# Patient Record
Sex: Female | Born: 1955 | Race: Black or African American | Hispanic: No | Smoking: Current every day smoker
Health system: Southern US, Community
[De-identification: ages and names within clinical notes are randomized; demographics above are authoritative.]

## PROBLEM LIST (undated history)

## (undated) DIAGNOSIS — F419 Anxiety disorder, unspecified: Secondary | ICD-10-CM

## (undated) DIAGNOSIS — C3490 Malignant neoplasm of unspecified part of unspecified bronchus or lung: Secondary | ICD-10-CM

## (undated) DIAGNOSIS — C801 Malignant (primary) neoplasm, unspecified: Secondary | ICD-10-CM

## (undated) DIAGNOSIS — E785 Hyperlipidemia, unspecified: Secondary | ICD-10-CM

## (undated) DIAGNOSIS — J439 Emphysema, unspecified: Secondary | ICD-10-CM

## (undated) DIAGNOSIS — I10 Essential (primary) hypertension: Secondary | ICD-10-CM

## (undated) HISTORY — PX: ECTOPIC PREGNANCY SURGERY: SHX613

## (undated) HISTORY — PX: BREAST EXCISIONAL BIOPSY: SUR124

## (undated) HISTORY — PX: PARTIAL HYSTERECTOMY: SHX80

## (undated) HISTORY — DX: Malignant neoplasm of unspecified part of unspecified bronchus or lung: C34.90

## (undated) HISTORY — DX: Malignant (primary) neoplasm, unspecified: C80.1

## (undated) HISTORY — DX: Emphysema, unspecified: J43.9

## (undated) HISTORY — PX: DENTAL SURGERY: SHX609

## (undated) HISTORY — DX: Anxiety disorder, unspecified: F41.9

## (undated) HISTORY — PX: COLONOSCOPY W/ POLYPECTOMY: SHX1380

## (undated) HISTORY — DX: Hyperlipidemia, unspecified: E78.5

## (undated) HISTORY — DX: Essential (primary) hypertension: I10

## (undated) HISTORY — PX: BREAST LUMPECTOMY: SHX2

---

## 2003-03-05 ENCOUNTER — Emergency Department (HOSPITAL_COMMUNITY): Admission: EM | Admit: 2003-03-05 | Discharge: 2003-03-05 | Payer: Self-pay | Admitting: Emergency Medicine

## 2003-11-20 ENCOUNTER — Emergency Department (HOSPITAL_COMMUNITY): Admission: EM | Admit: 2003-11-20 | Discharge: 2003-11-20 | Payer: Self-pay | Admitting: *Deleted

## 2004-01-18 ENCOUNTER — Other Ambulatory Visit: Admission: RE | Admit: 2004-01-18 | Discharge: 2004-01-18 | Payer: Self-pay | Admitting: Obstetrics and Gynecology

## 2004-01-22 ENCOUNTER — Encounter: Admission: RE | Admit: 2004-01-22 | Discharge: 2004-01-22 | Payer: Self-pay | Admitting: Obstetrics and Gynecology

## 2004-02-27 ENCOUNTER — Encounter: Admission: RE | Admit: 2004-02-27 | Discharge: 2004-02-27 | Payer: Self-pay | Admitting: Internal Medicine

## 2004-09-04 ENCOUNTER — Encounter: Admission: RE | Admit: 2004-09-04 | Discharge: 2004-09-04 | Payer: Self-pay | Admitting: *Deleted

## 2005-02-04 ENCOUNTER — Encounter: Admission: RE | Admit: 2005-02-04 | Discharge: 2005-02-04 | Payer: Self-pay | Admitting: Obstetrics and Gynecology

## 2005-03-14 ENCOUNTER — Encounter: Admission: RE | Admit: 2005-03-14 | Discharge: 2005-03-14 | Payer: Self-pay | Admitting: Obstetrics and Gynecology

## 2005-05-25 ENCOUNTER — Encounter: Admission: RE | Admit: 2005-05-25 | Discharge: 2005-05-25 | Payer: Self-pay | Admitting: *Deleted

## 2006-11-04 ENCOUNTER — Encounter: Admission: RE | Admit: 2006-11-04 | Discharge: 2006-11-04 | Payer: Self-pay | Admitting: Obstetrics and Gynecology

## 2006-11-06 ENCOUNTER — Encounter: Admission: RE | Admit: 2006-11-06 | Discharge: 2006-11-06 | Payer: Self-pay | Admitting: Obstetrics and Gynecology

## 2007-04-20 ENCOUNTER — Emergency Department (HOSPITAL_COMMUNITY): Admission: EM | Admit: 2007-04-20 | Discharge: 2007-04-20 | Payer: Self-pay | Admitting: Emergency Medicine

## 2007-11-15 ENCOUNTER — Ambulatory Visit (HOSPITAL_COMMUNITY): Admission: RE | Admit: 2007-11-15 | Discharge: 2007-11-15 | Payer: Self-pay | Admitting: Family Medicine

## 2008-05-10 ENCOUNTER — Emergency Department (HOSPITAL_COMMUNITY): Admission: EM | Admit: 2008-05-10 | Discharge: 2008-05-10 | Payer: Self-pay | Admitting: Emergency Medicine

## 2008-05-26 ENCOUNTER — Encounter: Admission: RE | Admit: 2008-05-26 | Discharge: 2008-05-26 | Payer: Self-pay | Admitting: Internal Medicine

## 2008-12-13 ENCOUNTER — Ambulatory Visit (HOSPITAL_COMMUNITY): Admission: RE | Admit: 2008-12-13 | Discharge: 2008-12-13 | Payer: Self-pay | Admitting: Internal Medicine

## 2009-09-08 ENCOUNTER — Emergency Department (HOSPITAL_COMMUNITY): Admission: EM | Admit: 2009-09-08 | Discharge: 2009-09-08 | Payer: Self-pay | Admitting: Emergency Medicine

## 2010-03-08 ENCOUNTER — Ambulatory Visit (HOSPITAL_COMMUNITY)
Admission: RE | Admit: 2010-03-08 | Discharge: 2010-03-08 | Payer: Self-pay | Source: Home / Self Care | Attending: Unknown Physician Specialty | Admitting: Unknown Physician Specialty

## 2010-03-23 ENCOUNTER — Encounter: Payer: Self-pay | Admitting: Obstetrics and Gynecology

## 2010-06-13 LAB — TSH: TSH: 1.512 u[IU]/mL (ref 0.350–4.500)

## 2010-06-13 LAB — T4, FREE: Free T4: 0.71 ng/dL — ABNORMAL LOW (ref 0.89–1.80)

## 2011-01-16 ENCOUNTER — Other Ambulatory Visit (HOSPITAL_COMMUNITY): Payer: Self-pay | Admitting: Unknown Physician Specialty

## 2011-01-16 DIAGNOSIS — Z1231 Encounter for screening mammogram for malignant neoplasm of breast: Secondary | ICD-10-CM

## 2011-03-14 ENCOUNTER — Ambulatory Visit (HOSPITAL_COMMUNITY): Payer: BC Managed Care – PPO

## 2011-04-11 ENCOUNTER — Ambulatory Visit (HOSPITAL_COMMUNITY)
Admission: RE | Admit: 2011-04-11 | Discharge: 2011-04-11 | Disposition: A | Discharge status: Home / Self Care | Payer: BC Managed Care – PPO | Source: Ambulatory Visit | Attending: Unknown Physician Specialty | Admitting: Unknown Physician Specialty

## 2011-04-11 DIAGNOSIS — Z1231 Encounter for screening mammogram for malignant neoplasm of breast: Secondary | ICD-10-CM | POA: Insufficient documentation

## 2013-11-03 ENCOUNTER — Other Ambulatory Visit (HOSPITAL_COMMUNITY): Payer: Self-pay | Admitting: Radiology

## 2013-11-03 DIAGNOSIS — Z1231 Encounter for screening mammogram for malignant neoplasm of breast: Secondary | ICD-10-CM

## 2013-11-23 ENCOUNTER — Ambulatory Visit (HOSPITAL_COMMUNITY)
Admission: RE | Admit: 2013-11-23 | Discharge: 2013-11-23 | Disposition: A | Discharge status: Home / Self Care | Payer: BC Managed Care – PPO | Source: Ambulatory Visit | Attending: Internal Medicine | Admitting: Internal Medicine

## 2013-11-23 DIAGNOSIS — Z1231 Encounter for screening mammogram for malignant neoplasm of breast: Secondary | ICD-10-CM | POA: Insufficient documentation

## 2015-02-05 ENCOUNTER — Other Ambulatory Visit: Payer: Self-pay

## 2015-02-05 DIAGNOSIS — Z1231 Encounter for screening mammogram for malignant neoplasm of breast: Secondary | ICD-10-CM

## 2015-03-12 ENCOUNTER — Ambulatory Visit: Payer: BC Managed Care – PPO

## 2016-02-11 ENCOUNTER — Other Ambulatory Visit: Payer: Self-pay | Admitting: Physician Assistant

## 2016-02-11 DIAGNOSIS — Z1231 Encounter for screening mammogram for malignant neoplasm of breast: Secondary | ICD-10-CM

## 2016-03-04 ENCOUNTER — Ambulatory Visit
Admission: RE | Admit: 2016-03-04 | Discharge: 2016-03-04 | Disposition: A | Discharge status: Home / Self Care | Payer: BC Managed Care – PPO | Source: Ambulatory Visit | Attending: Physician Assistant | Admitting: Physician Assistant

## 2016-03-04 DIAGNOSIS — Z1231 Encounter for screening mammogram for malignant neoplasm of breast: Secondary | ICD-10-CM

## 2018-01-08 ENCOUNTER — Other Ambulatory Visit: Payer: Self-pay

## 2018-01-08 DIAGNOSIS — Z1231 Encounter for screening mammogram for malignant neoplasm of breast: Secondary | ICD-10-CM

## 2018-01-20 ENCOUNTER — Other Ambulatory Visit: Payer: Self-pay | Admitting: Internal Medicine

## 2018-01-20 DIAGNOSIS — Z720 Tobacco use: Secondary | ICD-10-CM

## 2018-02-19 ENCOUNTER — Ambulatory Visit
Admission: RE | Admit: 2018-02-19 | Discharge: 2018-02-19 | Disposition: A | Discharge status: Home / Self Care | Payer: BC Managed Care – PPO | Source: Ambulatory Visit | Attending: Internal Medicine | Admitting: Internal Medicine

## 2018-02-19 DIAGNOSIS — Z1231 Encounter for screening mammogram for malignant neoplasm of breast: Secondary | ICD-10-CM

## 2020-11-06 ENCOUNTER — Other Ambulatory Visit: Payer: Self-pay | Admitting: Internal Medicine

## 2020-11-06 DIAGNOSIS — Z1231 Encounter for screening mammogram for malignant neoplasm of breast: Secondary | ICD-10-CM

## 2020-12-14 ENCOUNTER — Ambulatory Visit: Payer: BC Managed Care – PPO

## 2021-01-11 ENCOUNTER — Ambulatory Visit: Payer: Self-pay

## 2021-03-21 ENCOUNTER — Ambulatory Visit
Admission: RE | Admit: 2021-03-21 | Discharge: 2021-03-21 | Disposition: A | Discharge status: Home / Self Care | Payer: Medicare PPO | Source: Ambulatory Visit | Attending: Internal Medicine | Admitting: Internal Medicine

## 2021-03-21 DIAGNOSIS — Z1231 Encounter for screening mammogram for malignant neoplasm of breast: Secondary | ICD-10-CM

## 2021-03-22 ENCOUNTER — Other Ambulatory Visit: Payer: Self-pay | Admitting: Internal Medicine

## 2021-03-22 DIAGNOSIS — R928 Other abnormal and inconclusive findings on diagnostic imaging of breast: Secondary | ICD-10-CM

## 2021-04-15 ENCOUNTER — Ambulatory Visit: Payer: Medicare PPO

## 2021-04-15 ENCOUNTER — Ambulatory Visit
Admission: RE | Admit: 2021-04-15 | Discharge: 2021-04-15 | Disposition: A | Discharge status: Home / Self Care | Payer: Medicare PPO | Source: Ambulatory Visit | Attending: Internal Medicine | Admitting: Internal Medicine

## 2021-04-15 DIAGNOSIS — R928 Other abnormal and inconclusive findings on diagnostic imaging of breast: Secondary | ICD-10-CM

## 2021-04-24 ENCOUNTER — Other Ambulatory Visit: Payer: Medicare PPO

## 2022-06-09 ENCOUNTER — Other Ambulatory Visit: Payer: Self-pay | Admitting: Internal Medicine

## 2022-06-09 DIAGNOSIS — Z780 Asymptomatic menopausal state: Secondary | ICD-10-CM

## 2022-06-11 ENCOUNTER — Other Ambulatory Visit: Payer: Self-pay | Admitting: Internal Medicine

## 2022-06-11 DIAGNOSIS — E2839 Other primary ovarian failure: Secondary | ICD-10-CM

## 2022-06-19 ENCOUNTER — Other Ambulatory Visit: Payer: Self-pay | Admitting: Internal Medicine

## 2022-06-19 DIAGNOSIS — E2839 Other primary ovarian failure: Secondary | ICD-10-CM

## 2022-06-19 DIAGNOSIS — Z1231 Encounter for screening mammogram for malignant neoplasm of breast: Secondary | ICD-10-CM

## 2022-08-01 ENCOUNTER — Ambulatory Visit
Admission: RE | Admit: 2022-08-01 | Discharge: 2022-08-01 | Disposition: A | Discharge status: Home / Self Care | Payer: Medicare PPO | Source: Ambulatory Visit | Attending: Internal Medicine | Admitting: Internal Medicine

## 2022-08-01 DIAGNOSIS — Z1231 Encounter for screening mammogram for malignant neoplasm of breast: Secondary | ICD-10-CM

## 2022-09-04 NOTE — Progress Notes (Signed)
[facility]  [address] [place],  [place]  [address] [phone]  Clinic Day:  09/05/2022  Referring physician: Kathaleen Bury*   HISTORY OF PRESENT ILLNESS:  The patient is a 67 y.o. female  who I was asked to consult upon for extensive metastatic disease to her liver.  Her history dates back approximately 2 weeks ago when she began having left-sided abdominal pain which significantly worsened over 24 hours.  This led to her going to urgent care for further evaluation.  Due to how concerning her clinical picture was, it was recommended that she go to the emergency room for further evaluation.  Initially, an abdominal ultrasound was done, which showed that her liver was grossly abnormal, with lesions being seen throughout both lobes.  An abdominal CT scan was done for confirmation, which showed that her liver was heavily inundated  with metastatic disease.  A chest CT was also done, for which a 2.9 x 2.7 cm right upper lobe mass was appreciated, which is likely the origin of her extensive metastatic liver disease.  Of note, the patient is scheduled to receive a liver biopsy next week to establish her definitive diagnosis.  However, the patient has been in significant pain over these past few days.  Despite having oxycodone for pain relief, her abdominal pain from her metastatic liver disease remains significant. She comes in today to get a better idea of what her clinical picture represents.  The patient has lost approximately 20 pounds over the past few months.  She admits to smoking as much at 2 packs of cigarettes daily for the past 40 years.  PAST MEDICAL HISTORY:   Past Medical History:  Diagnosis Date   Anxiety    Cancer (HCC)    Emphysema of lung (HCC)    Hyperlipidemia    Hypertension    Lung cancer (HCC)     PAST SURGICAL HISTORY:   Past Surgical History:  Procedure Laterality Date   BREAST EXCISIONAL BIOPSY Bilateral    BREAST  LUMPECTOMY Bilateral    CESAREAN SECTION     X 1   COLONOSCOPY W/ POLYPECTOMY     DENTAL SURGERY     IMPLANTS : UPPER WISDOM TEETH   ECTOPIC PREGNANCY SURGERY     PARTIAL HYSTERECTOMY      CURRENT MEDICATIONS:   Current Outpatient Medications  Medication Sig Dispense Refill   albuterol (VENTOLIN HFA) 108 (90 Base) MCG/ACT inhaler Inhale into the lungs every 6 (six) hours as needed for wheezing or shortness of breath.     amLODipine (NORVASC) 5 MG tablet Take 5 mg by mouth daily.     cyclobenzaprine (FLEXERIL) 5 MG tablet Take 5 mg by mouth 3 (three) times daily as needed for muscle spasms.     hydrOXYzine (ATARAX) 50 MG tablet Take 50 mg by mouth 3 (three) times daily as needed.     morphine (MS CONTIN) 30 MG 12 hr tablet Take 1 tablet (30 mg total) by mouth every 12 (twelve) hours. 60 tablet 0   oxyCODONE (ROXICODONE) 15 MG immediate release tablet One tab po q4-6 hr prn 120 tablet 0   No current facility-administered medications for this visit.    ALLERGIES:   Allergies  Allergen Reactions   Lactose Nausea And Vomiting   Melatonin Rash    FAMILY HISTORY:   Family History  Problem Relation Age of Onset   Lung cancer Mother    Dementia Mother    Lung cancer  Sister    Lung cancer Paternal Aunt     SOCIAL HISTORY:  The patient was born and raised in [place].  She currently lives in [place].  She is divorced, with 1 son and 3 grandchildren.  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  Her prominent smoking is as mentioned per HPI.  She also claims to consume an alcoholic beverage nightly.  REVIEW OF SYSTEMS:  Review of Systems  Constitutional:  Positive for unexpected weight change. Negative for fatigue and fever.  HENT:   Positive for hearing loss. Negative for sore throat.   Eyes:  Negative for eye problems.  Respiratory:  Positive for cough and shortness of breath. Negative for chest tightness and hemoptysis.   Cardiovascular:   Negative for chest pain and palpitations.  Gastrointestinal:  Positive for constipation. Negative for abdominal distention, abdominal pain, blood in stool, diarrhea, nausea and vomiting.  Endocrine: Negative for hot flashes.  Genitourinary:  Negative for difficulty urinating, dysuria, frequency, hematuria and nocturia.   Musculoskeletal:  Negative for arthralgias, back pain, gait problem and myalgias.  Skin: Negative.  Negative for itching and rash.  Neurological: Negative.  Negative for dizziness, extremity weakness, gait problem, headaches, light-headedness and numbness.  Hematological: Negative.   Psychiatric/Behavioral: Negative.  Negative for depression and suicidal ideas. The patient is not nervous/anxious.      PHYSICAL EXAM:  Blood pressure 118/72, pulse (!) 132, temperature 98.9 F (37.2 C), resp. rate 16, height 5\' 4"  (1.626 m), SpO2 94 %. Wt Readings from Last 3 Encounters:  No data found for Wt   There is no height or weight on file to calculate BMI. Performance status (ECOG): 2 - Symptomatic, <50% confined to bed Physical Exam Constitutional:      Appearance: Normal appearance. She is not ill-appearing.     Comments: She is in a wheelchair  HENT:     Mouth/Throat:     Mouth: Mucous membranes are moist.     Pharynx: Oropharynx is clear. No oropharyngeal exudate or posterior oropharyngeal erythema.  Eyes:     General: Scleral icterus present.  Cardiovascular:     Rate and Rhythm: Normal rate and regular rhythm.     Heart sounds: No murmur heard.    No friction rub. No gallop.  Pulmonary:     Effort: Pulmonary effort is normal. No respiratory distress.     Breath sounds: Normal breath sounds. No wheezing, rhonchi or rales.  Abdominal:     General: Bowel sounds are normal. There is no distension.     Palpations: Abdomen is soft. There is hepatomegaly. There is no mass.     Tenderness: There is no abdominal tenderness.     Comments: Her liver can be palpated multiple  centimeters below her right costal margin.  It also extends significantly across her midline.  Musculoskeletal:        General: No swelling.     Right lower leg: No edema.     Left lower leg: No edema.  Lymphadenopathy:     Cervical: No cervical adenopathy.     Upper Body:     Right upper body: No supraclavicular or axillary adenopathy.     Left upper body: No supraclavicular or axillary adenopathy.     Lower Body: No right inguinal adenopathy. No left inguinal adenopathy.  Skin:    General: Skin is warm.     Coloration: Skin is not jaundiced.     Findings: No lesion or rash.  Neurological:     General: No focal deficit present.     Mental Status: She is alert and oriented to person, place, and time. Mental status is at baseline.  Psychiatric:        Mood and Affect: Mood normal.        Behavior: Behavior normal.        Thought Content: Thought content normal.   LABS:     STUDIES:  CT scans on 09-03-22 revealed:  FINDINGS: Lower chest: No acute abnormality.  Hepatobiliary: Liver is enlarged with diffuse metastatic lesions of different sizes. No gallstones, gallbladder wall thickening or biliary dilatation.  Pancreas: Unremarkable. No pancreatic ductal dilatation or surrounding inflammatory changes.  Spleen: Normal in size without focal abnormality.  Adrenals/Urinary Tract: Adrenal glands are unremarkable. Kidneys are normal, without renal calculi, focal lesion, or hydronephrosis. Mild focal renal cortical atrophy of the lower pole of the left kidney, which may be sequela of prior infection. Bladder is unremarkable.  Stomach/Bowel: Stomach is within normal limits. Appendix not visualized. No evidence of bowel wall thickening, distention, or inflammatory changes. Scattered colonic diverticulosis without evidence of acute diverticulitis  Vascular/Lymphatic: Aortic atherosclerosis. No enlarged abdominal or pelvic lymph nodes.  Reproductive: Status post hysterectomy. No  adnexal masses.  Other: Small perihepatic and dependent pelvic ascites  Musculoskeletal: No acute or significant osseous findings.  IMPRESSION: 1. Hepatomegaly with diffuse metastatic lesions of different sizes, consistent with diffuse metastatic disease. 2. Small perihepatic and dependent pelvic ascites. 3. Scattered colonic diverticulosis without evidence of acute diverticulitis. 4. No evidence of urinary tract obstruction or calculus. 5. Aortic atherosclerosis.  Aortic Atherosclerosis (ICD10-I70.0).   ASSESSMENT & PLAN:  A 67 y.o. female who I was asked to consult upon for having extensive metastatic liver disease.  Based upon her most recent CT scan, it appears that at least 80-90% of her liver is inundated with metastatic disease.  When factoring this in with her right upper lobe lung mass and her chronic smoking, I believe this patient likely has extensive stage small cell lung cancer.  As mentioned previously, she is scheduled for a liver biopsy within the forthcoming days.  In clinic today, I had a very candid conversation with the patient.  She is on the precipice of going into fulminant hepatic failure, which will likely be the reason why she expires.  Unfortunately, I do not believe her life expectancy will extend beyond this calendar month.  Based upon this, I believe it is worth taking more of a comfort care-only approach for her cancer management.  I will have hospice come in to see her immediately so maximal comfort care measures can be initiated.  I do not believe it is worth her undergoing a liver biopsy as her current health precludes me from considering any form of the systemic therapy that could bring her very advanced disease under control.  Furthermore, I am not convinced any form of systemic therapy will work fast enough to reverse the extensive metastatic disease within the liver.  The patient does not disagree with this, particularly as she has noticed the significant  decline in her health over these past few weeks.  I will work very closely with hospice to provide this patient whatever she needs to maximize her daily quality of life for whatever time she has left.  No follow-up appointments will be scheduled.  I encouraged both the patient and her family to contact our office over these next few days/weeks if they have questions/concerns regarding her unfortunate  clinical picture.  The patient understands all the plans discussed today and is in agreement with them.  I do appreciate Kathaleen Bury* for his new consult.   Akesha Uresti Kirby Funk, MD

## 2022-09-05 ENCOUNTER — Encounter: Payer: Self-pay | Admitting: Oncology

## 2022-09-05 ENCOUNTER — Inpatient Hospital Stay: Payer: Medicare PPO

## 2022-09-05 ENCOUNTER — Inpatient Hospital Stay: Payer: Medicare PPO | Attending: Oncology | Admitting: Oncology

## 2022-09-05 ENCOUNTER — Other Ambulatory Visit: Payer: Self-pay | Admitting: Oncology

## 2022-09-05 VITALS — BP 118/72 | HR 132 | Temp 98.9°F | Resp 16 | Ht 64.0 in

## 2022-09-05 DIAGNOSIS — C3490 Malignant neoplasm of unspecified part of unspecified bronchus or lung: Secondary | ICD-10-CM

## 2022-09-05 DIAGNOSIS — F17210 Nicotine dependence, cigarettes, uncomplicated: Secondary | ICD-10-CM

## 2022-09-05 DIAGNOSIS — C787 Secondary malignant neoplasm of liver and intrahepatic bile duct: Secondary | ICD-10-CM

## 2022-09-05 MED ORDER — MORPHINE SULFATE ER 30 MG PO TBCR: 30.0000 mg | EXTENDED_RELEASE_TABLET | Freq: Two times a day (BID) | ORAL | 0 refills | Status: DC

## 2022-09-05 MED ORDER — OXYCODONE HCL 15 MG PO TABS: ORAL_TABLET | ORAL | 0 refills | Status: DC

## 2022-09-07 DIAGNOSIS — C787 Secondary malignant neoplasm of liver and intrahepatic bile duct: Secondary | ICD-10-CM | POA: Insufficient documentation

## 2022-10-02 DEATH — deceased

## 2023-01-02 ENCOUNTER — Other Ambulatory Visit: Payer: Medicare PPO

## 2023-10-30 IMAGING — MG MM DIGITAL DIAGNOSTIC UNILAT*L* W/ TOMO W/ CAD
6 series · 6 of 18 positions shown · non-contrast
Comparison: Previous exam(s).

CLINICAL DATA: Patient returns after screening study for evaluation
of possible LEFT breast asymmetry. History of benign excisional
biopsy in the UPPER-OUTER QUADRANT of the LEFT breast.

EXAM:
DIGITAL DIAGNOSTIC UNILATERAL LEFT MAMMOGRAM WITH TOMOSYNTHESIS AND
CAD
TECHNIQUE: Left digital diagnostic mammography and breast tomosynthesis was
performed. The images were evaluated with computer-aided detection.

[L CC synth-2D (1 of 2)]
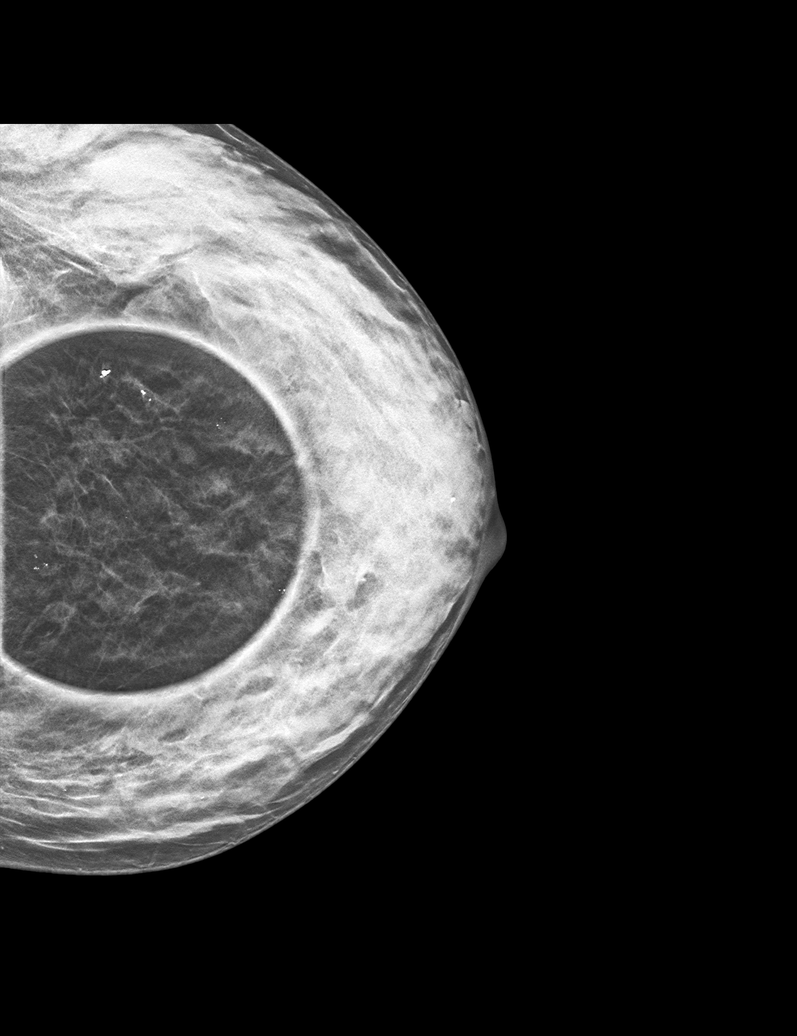

[L CC synth-2D (2 of 2)]
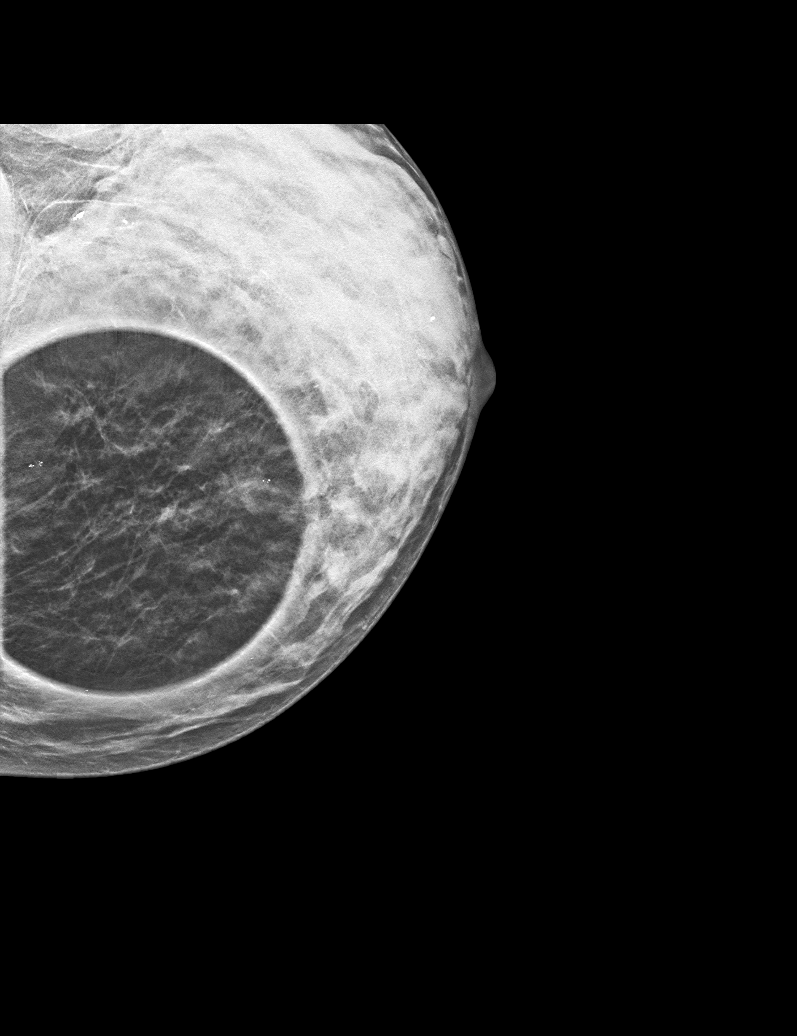

[L ML synth-2D]
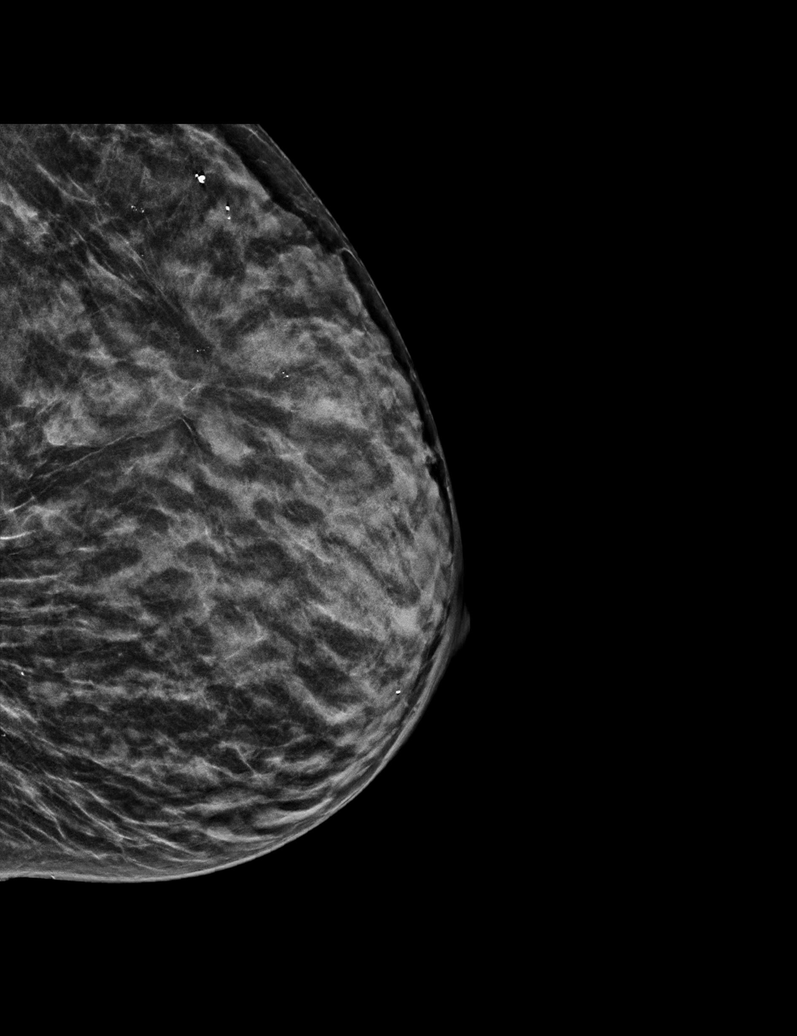

[L ML tomo · tomo slice 19/38.0]
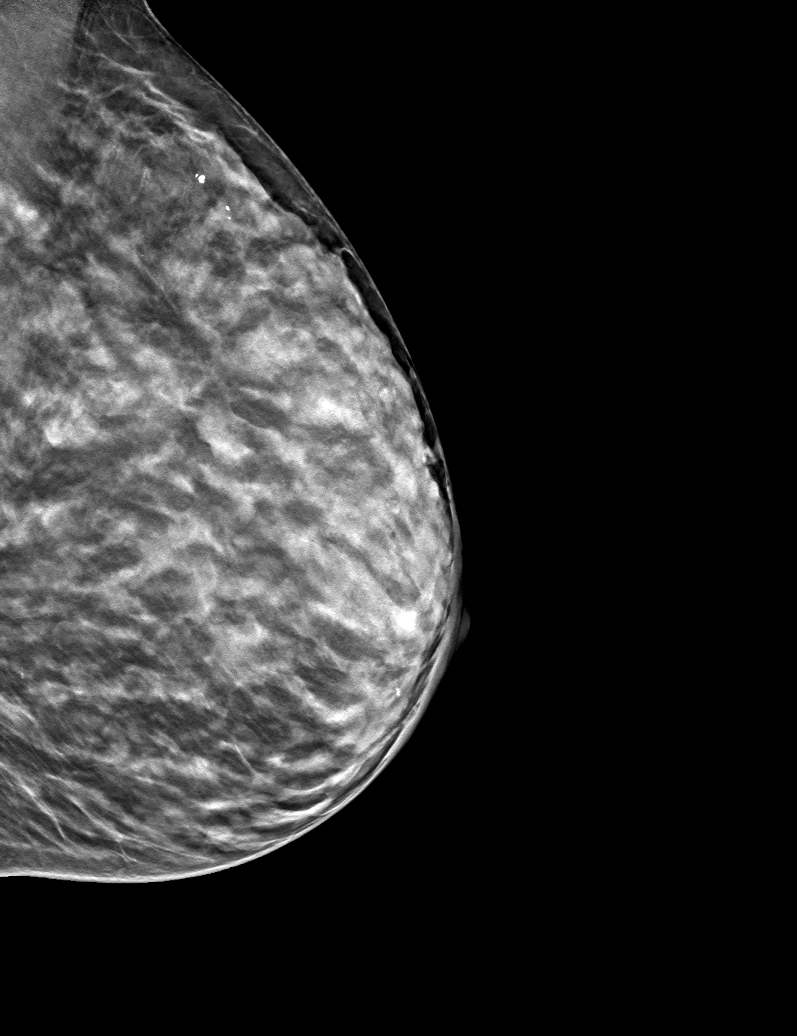

[L CC tomo (1 of 2) · tomo slice 15/28.0]
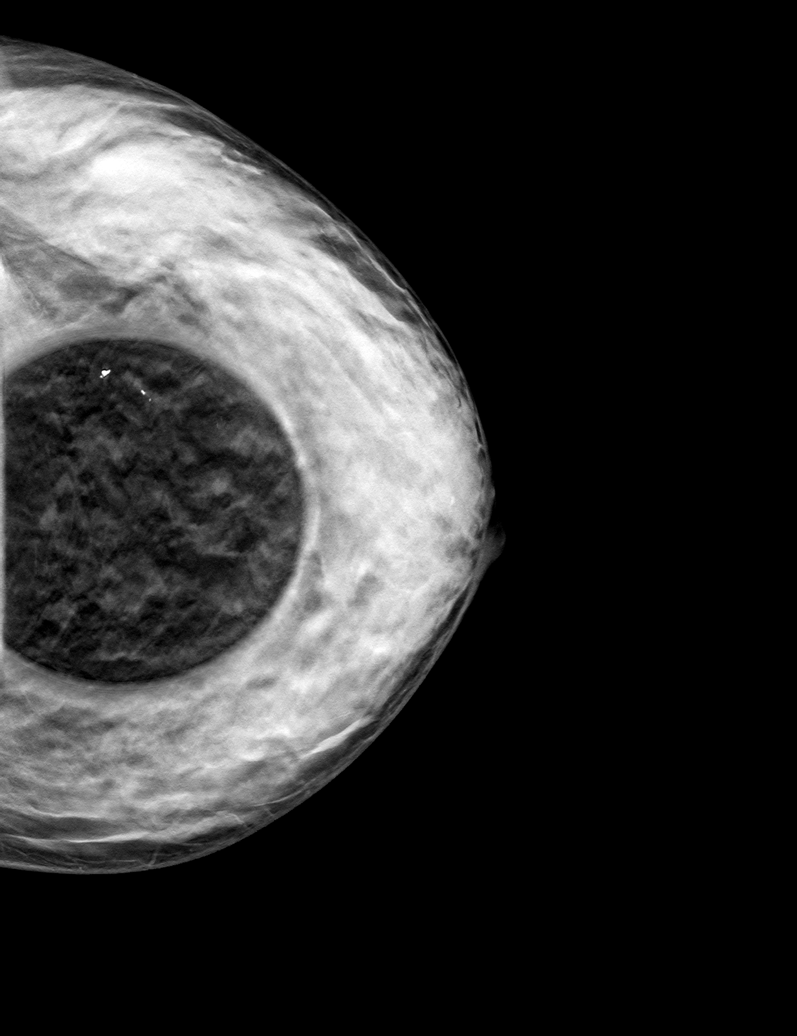

[L CC tomo (2 of 2) · tomo slice 14/27.0]
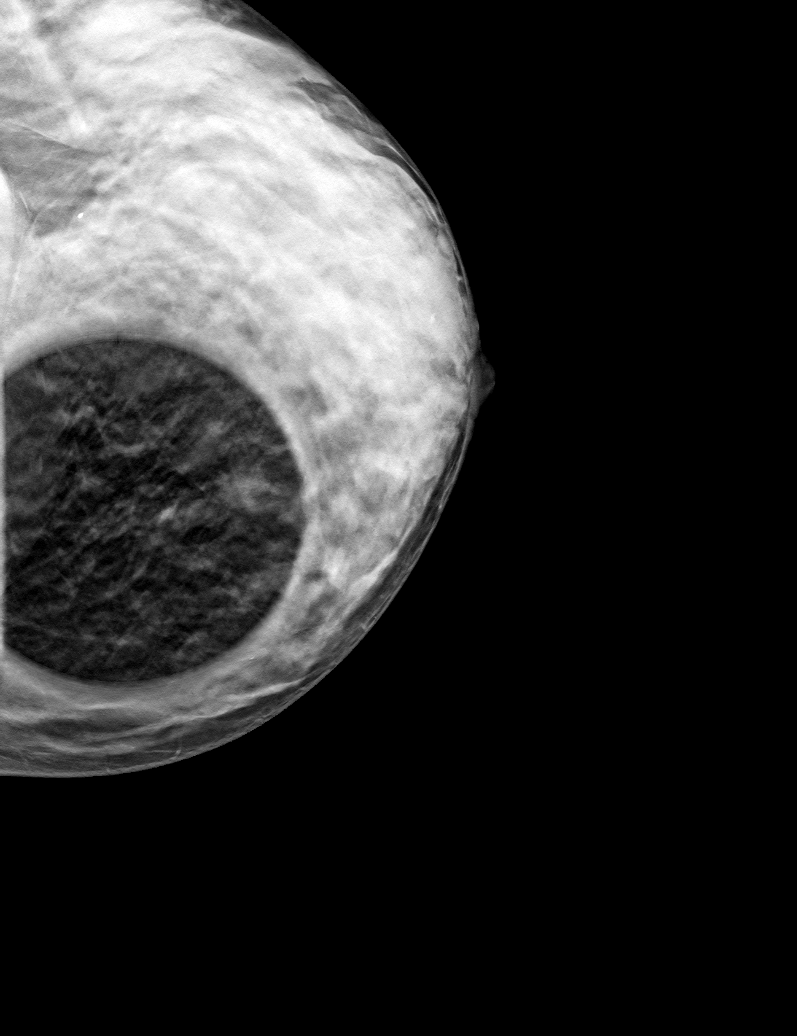

[6 of 18 positions shown; findings below may reference images not displayed]

ACR Breast Density Category d: The breast tissue is extremely dense,
which lowers the sensitivity of mammography.
FINDINGS: Additional 2-D and 3-D images are performed. These views show no
persistent asymmetry or distortion in the central portion of LEFT
breast. No suspicious mass, distortion, or acoustic shadowing is
demonstrated with ultrasound.
IMPRESSION: No mammographic evidence for malignancy.

RECOMMENDATION:
Screening mammogram in one year.(Code:[id number])

I have discussed the findings and recommendations with the patient.
If applicable, a reminder letter will be sent to the patient
regarding the next appointment.

BI-RADS CATEGORY  1: Negative.
